# Patient Record
Sex: Female | Born: 1960 | Race: White | Hispanic: No | Marital: Single
Health system: Southern US, Community
[De-identification: ages and names within clinical notes are randomized; demographics above are authoritative.]

---

## 2007-01-10 ENCOUNTER — Ambulatory Visit: Payer: Self-pay | Admitting: Unknown Physician Specialty

## 2009-01-12 ENCOUNTER — Ambulatory Visit: Payer: Self-pay | Admitting: Unknown Physician Specialty

## 2009-04-20 ENCOUNTER — Ambulatory Visit: Payer: Self-pay | Admitting: Unknown Physician Specialty

## 2009-04-22 ENCOUNTER — Ambulatory Visit: Payer: Self-pay | Admitting: Unknown Physician Specialty

## 2011-11-29 ENCOUNTER — Ambulatory Visit: Payer: Self-pay | Admitting: Unknown Physician Specialty

## 2017-04-11 ENCOUNTER — Ambulatory Visit
Admission: RE | Admit: 2017-04-11 | Discharge: 2017-04-11 | Disposition: A | Discharge status: Home / Self Care | Payer: Worker's Compensation | Source: Ambulatory Visit | Attending: Family | Admitting: Family

## 2017-04-11 ENCOUNTER — Other Ambulatory Visit: Payer: Self-pay | Admitting: Family

## 2017-04-11 DIAGNOSIS — R059 Cough, unspecified: Secondary | ICD-10-CM

## 2017-04-11 DIAGNOSIS — R05 Cough: Secondary | ICD-10-CM

## 2018-03-28 IMAGING — CR DG CHEST 2V
1 series · 2 of 2 positions shown · non-contrast
Comparison: 04/20/2009 chest radiographs

CLINICAL DATA: Cough for 1 month after exposure to black mold.
History of breast cancer.

EXAM:
CHEST  2 VIEW

[Series 1: dg chest 2 view · 0.14mm/px · 2 of 2 slices shown]
[im 1/2]
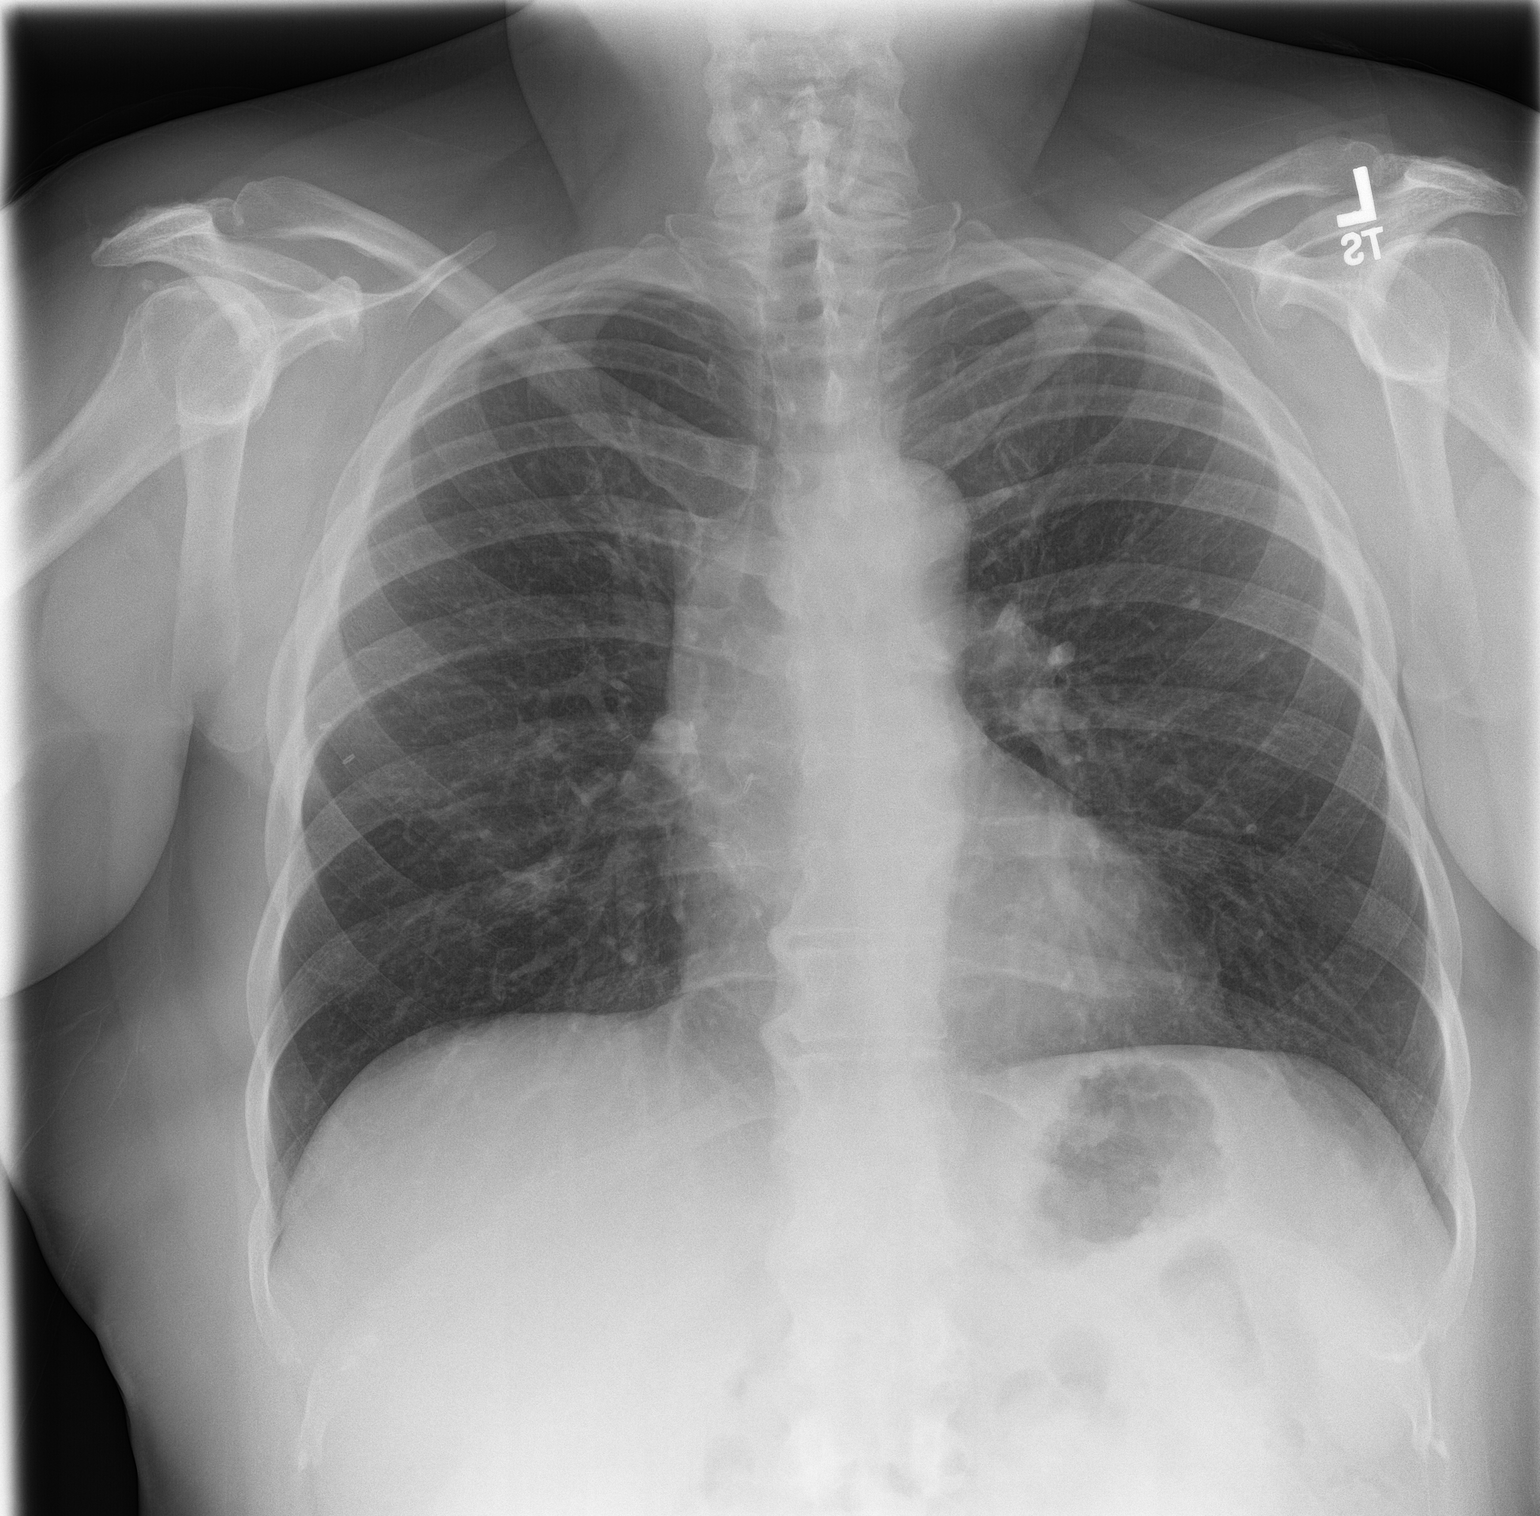
[im 2/2]
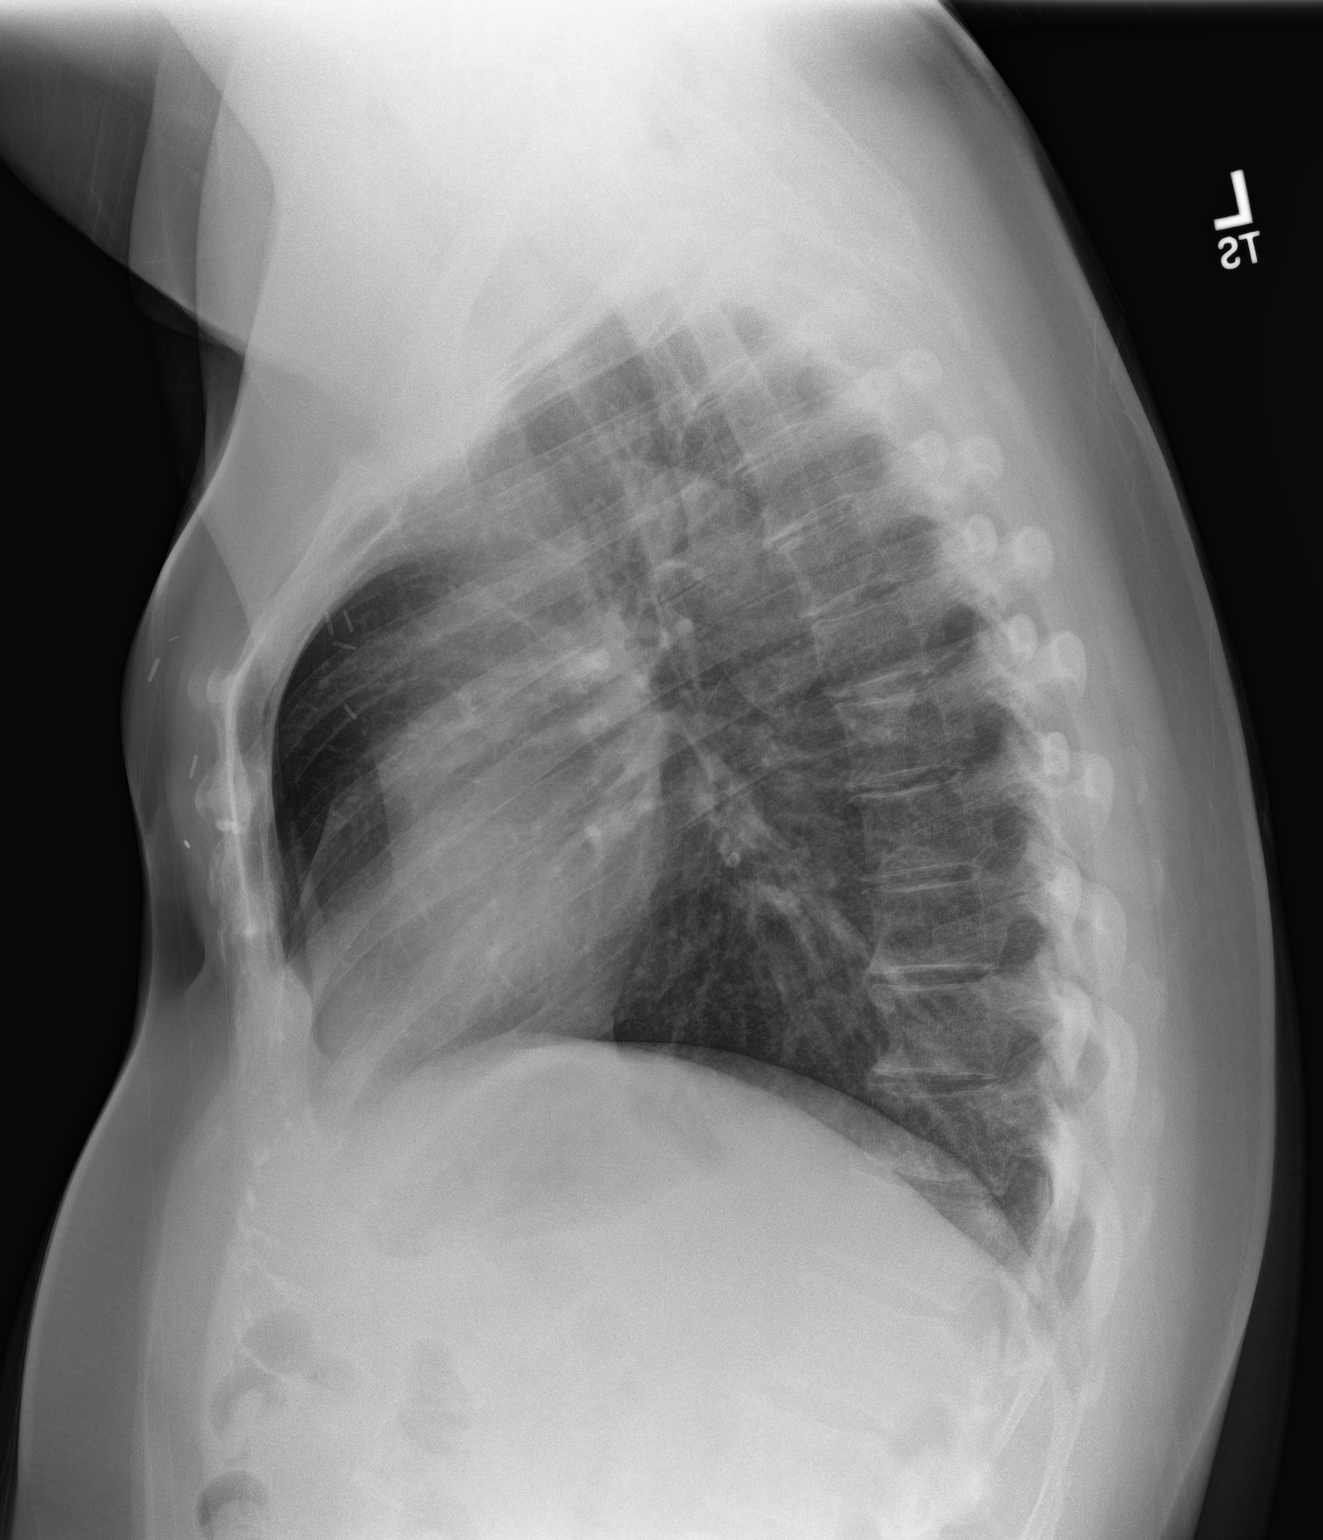

[2 of 2 positions shown; findings below may reference images not displayed]

FINDINGS: The cardiomediastinal silhouette is unremarkable.

There is no evidence of focal airspace disease, pulmonary edema,
suspicious pulmonary nodule/mass, pleural effusion, or pneumothorax.
No acute bony abnormalities are identified.

Bilateral mastectomies noted.
IMPRESSION: No active cardiopulmonary disease.

## 2019-11-01 ENCOUNTER — Ambulatory Visit: Payer: Self-pay | Attending: Internal Medicine

## 2019-11-01 DIAGNOSIS — Z23 Encounter for immunization: Secondary | ICD-10-CM | POA: Insufficient documentation

## 2019-11-01 NOTE — Progress Notes (Signed)
   Covid-19 Vaccination Clinic  Name:  Natalie Blankenship    MRN: [id number] DOB: 1961/05/02  11/01/2019  [name] was observed post Covid-19 immunization for 15 minutes without incidence. She was provided with Vaccine Information Sheet and instruction to access the V-Safe system.   [name] was instructed to call 911 with any severe reactions post vaccine: Marland Kitchen Difficulty breathing  . Swelling of your face and throat  . A fast heartbeat  . A bad rash all over your body  . Dizziness and weakness    Immunizations Administered    Name Date Dose VIS Date Route   Pfizer COVID-19 Vaccine 11/01/2019  1:49 PM 0.3 mL 08/29/2019 Intramuscular   Manufacturer: [employer], [employer]   Lot: [id number]   NDC: [address]-7

## 2019-11-24 ENCOUNTER — Ambulatory Visit: Payer: Self-pay | Attending: Internal Medicine

## 2019-11-24 DIAGNOSIS — Z23 Encounter for immunization: Secondary | ICD-10-CM | POA: Insufficient documentation

## 2019-11-24 NOTE — Progress Notes (Signed)
   Covid-19 Vaccination Clinic  Name:  Natalie Blankenship    MRN: [id number] DOB: [birth year]  11/24/2019  Natalie Blankenship was observed post Covid-19 immunization for 15 minutes without incident. She was provided with Vaccine Information Sheet and instruction to access the V-Safe system.   Natalie Blankenship was instructed to call 911 with any severe reactions post vaccine: Marland Kitchen Difficulty breathing  . Swelling of face and throat  . A fast heartbeat  . A bad rash all over body  . Dizziness and weakness   Immunizations Administered    Name Date Dose VIS Date Route   Pfizer COVID-19 Vaccine 11/24/2019  6:26 PM 0.3 mL 08/29/2019 Intramuscular   Manufacturer: [employer], [employer]   Lot: [id number]   NDC: [address]-9
